# Patient Record
Sex: Male | Born: 1970
Health system: Southern US, Community
[De-identification: ages and names within clinical notes are randomized; demographics above are authoritative.]

---

## 1998-05-26 ENCOUNTER — Emergency Department (HOSPITAL_COMMUNITY): Admission: EM | Admit: 1998-05-26 | Discharge: 1998-05-26 | Payer: Self-pay | Admitting: Emergency Medicine

## 1998-05-26 ENCOUNTER — Encounter: Payer: Self-pay | Admitting: Emergency Medicine

## 2002-07-03 ENCOUNTER — Emergency Department (HOSPITAL_COMMUNITY): Admission: EM | Admit: 2002-07-03 | Discharge: 2002-07-03 | Payer: Self-pay | Admitting: Emergency Medicine

## 2014-12-10 ENCOUNTER — Ambulatory Visit: Payer: Self-pay

## 2017-04-28 ENCOUNTER — Encounter (INDEPENDENT_AMBULATORY_CARE_PROVIDER_SITE_OTHER): Payer: Self-pay | Admitting: Physician Assistant

## 2017-04-28 ENCOUNTER — Ambulatory Visit (INDEPENDENT_AMBULATORY_CARE_PROVIDER_SITE_OTHER): Payer: Self-pay | Admitting: Physician Assistant

## 2017-04-28 VITALS — BP 142/88 | HR 70 | Temp 97.8°F | Ht 67.5 in | Wt 166.6 lb

## 2017-04-28 DIAGNOSIS — H538 Other visual disturbances: Secondary | ICD-10-CM

## 2017-04-28 DIAGNOSIS — Z131 Encounter for screening for diabetes mellitus: Secondary | ICD-10-CM

## 2017-04-28 DIAGNOSIS — R1013 Epigastric pain: Secondary | ICD-10-CM

## 2017-04-28 DIAGNOSIS — Z23 Encounter for immunization: Secondary | ICD-10-CM

## 2017-04-28 DIAGNOSIS — M5442 Lumbago with sciatica, left side: Secondary | ICD-10-CM

## 2017-04-28 DIAGNOSIS — G8929 Other chronic pain: Secondary | ICD-10-CM

## 2017-04-28 LAB — POCT GLYCOSYLATED HEMOGLOBIN (HGB A1C): Hemoglobin A1C: 5.4

## 2017-04-28 MED ORDER — NAPROXEN 500 MG PO TABS: 500.0000 mg | ORAL_TABLET | Freq: Two times a day (BID) | ORAL | 0 refills | Status: AC

## 2017-04-28 MED ORDER — CYCLOBENZAPRINE HCL 10 MG PO TABS: 10.0000 mg | ORAL_TABLET | Freq: Three times a day (TID) | ORAL | 0 refills | Status: AC | PRN

## 2017-04-28 MED ORDER — OMEPRAZOLE 40 MG PO CPDR: 40.0000 mg | DELAYED_RELEASE_CAPSULE | Freq: Every day | ORAL | 3 refills | Status: AC

## 2017-04-28 MED FILL — NAPROXEN 500 MG TABLET: 500 | 15 days supply | Qty: 30 | Fill #0

## 2017-04-28 MED FILL — ?CYCLOBENZAPRINE 10 MG TABL: 10 | 10 days supply | Qty: 30 | Fill #0

## 2017-04-28 MED FILL — OMEPRAZOLE 40 MG CPDR: 40 | 30 days supply | Qty: 30 | Fill #0

## 2017-04-28 NOTE — Patient Instructions (Signed)
Distensión muscular.  (Muscle Strain)  Una distensión muscular es una lesión que se produce cuando un músculo se estira más allá de su largo normal. Cuando esto sucede, por lo general se desgarra un pequeño número de fibras musculares. La distensión muscular se califica en grados. Las distensiones de primer grado son aquellas en las cuales el desgarro y el dolor afectan a la menor cantidad de fibras musculares. Las distensiones de segundo y tercer grado involucran una proporción cada vez mayor de desgarro y dolor.  En general, la recuperación de una distensión muscular tarda de 1 a 2 semanas. La curación completa tarda de 5 a 6 semanas.  CAUSAS  Las distensiones musculares ocurren cuando se aplica una fuerza violenta y repentina sobre un músculo y este se estira demasiado. Esto puede ocurrir cuando se levantan objetos, se practican deportes o en una caída.  FACTORES DE RIESGO  La distensión muscular es especialmente común en los atletas.  SIGNOS Y SÍNTOMAS  En el lugar de la distensión muscular se puede presentar lo siguiente:  · Dolor.  · Moretones.  · Hinchazón.  · Dificultad para usar el músculo debido al dolor o a un funcionamiento anormal.  DIAGNÓSTICO  El médico le hará un examen físico y le hará preguntas sobre sus antecedentes médicos.  TRATAMIENTO  Con frecuencia, el mejor tratamiento para una distensión muscular es el reposo, y la aplicación de hielo y de compresas frías en la zona de la lesión.  INSTRUCCIONES PARA EL CUIDADO EN EL HOGAR  · Use el método PRICE (por sus siglas en inglés) de tratamiento para estimular la curación durante los primeros 2 a 3 días posteriores a la lesión. El método PRICE implica lo siguiente:  ? Proteger al músculo de nuevas lesiones.  ? Limitar la actividad y descansar la parte del cuerpo lesionada.  ? Aplicar hielo a la lesión. Para hacerlo, ponga hielo en una bolsa plástica. Coloque una toalla entre la piel y la bolsa de hielo. Luego aplique el hielo y déjelo actuar de 15 a  20 minutos por hora. Después del tercer día, cambie a compresas de calor húmedo.  ? Comprimir la zona lesionada con una férula o venda elástica. Tenga cuidado de no ajustarla demasiado. Esto puede interferir con la circulación sanguínea o aumentar la hinchazón.  ? Mantener la zona lesionada por encima del nivel del corazón con la mayor frecuencia posible.  · Utilice los medicamentos de venta libre o recetados para calmar el dolor, el malestar o la fiebre, según se lo indique el médico.  · Realizar un calentamiento antes de hacer ejercicio ayuda a prevenir distensiones musculares futuras.    SOLICITE ATENCIÓN MÉDICA SI:  · Siente un dolor cada vez más intenso o hinchazón en la zona lesionada.  · Siente adormecimiento, hormigueo o nota una pérdida importante de fuerza en la zona lesionada.    ASEGÚRESE DE QUE:  · Comprende estas instrucciones.  · Controlará su afección.  · Recibirá ayuda de inmediato si no mejora o si empeora.    Esta información no tiene como fin reemplazar el consejo del médico. Asegúrese de hacerle al médico cualquier pregunta que tenga.  Document Released: 03/25/2005 Document Revised: 04/05/2013 Document Reviewed: 01/12/2013  Elsevier Interactive Patient Education © 2017 [employer]

## 2017-04-28 NOTE — Progress Notes (Signed)
Subjective:  Patient ID: David Rosario, male    DOB: 04/23/1971  Age: 46 y.o. MRN: [id number]  CC:  Back pain and LLQ abdominal pain  HPI David Rosario is a 46 y.o. male with no significant medical history presents as a new patient with complaint of left lower back pain for several months. Feels like "tension". Gets up from bed in the morning feeling stiff. Sometimes feels a jolt running from the lower back to the posterior of left leg.  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 No trauma. Does not endorse weakness, paralysis, saddle paresthesia, urinary incontinence, or fecal incontinence.    Complains of epigastric pain on occasion, bloating, and acid reflux for the past 3 months. Not sure if there is any association to foods. Has not taken anything for relief. Does not endorse melena, BRBPR, f/c/n/v, fecal urgency, tenesmus, or mucus in stool.    Complains of visual blurring that has progressed over that last few years. Mother is diabetic but he has not been diagnosed as a diabetic. Does not endorse polydipsia, polyuria, polyphagia, or fatigue. There is occasional tingling in the hands.      ROS Review of Systems  Constitutional: Negative for chills, fever and malaise/fatigue.  Eyes: Positive for blurred vision.  Respiratory: Negative for shortness of breath.   Cardiovascular: Negative for chest pain and palpitations.  Gastrointestinal: Positive for abdominal pain and heartburn. Negative for blood in stool, constipation, diarrhea, melena, nausea and vomiting.  Genitourinary: Negative for dysuria and hematuria.  Musculoskeletal: Positive for back pain and neck pain. Negative for joint pain and myalgias.  Skin: Negative for rash.  Neurological: Positive for tingling and headaches.  Psychiatric/Behavioral: Negative for depression. The patient is not nervous/anxious.     Objective:  BP (!) 142/88 (BP Location: Left Arm, Patient Position: Sitting, Cuff Size: Normal)    Pulse 70   Temp 97.8 F (36.6 C) (Oral)   Ht 5' 7.5" (1.715 m)   Wt 166 lb 9.6 oz (75.6 kg)   SpO2 97%   BMI 25.71 kg/m   BP/Weight 04/28/2017  Systolic BP 142  Diastolic BP 88  Wt. (Lbs) 166.6  BMI 25.71      Physical Exam  Constitutional: He is oriented to person, place, and time.  Well developed, well nourished, NAD, polite  HENT:  Head: Normocephalic and atraumatic.  Eyes: No scleral icterus.  Neck: Normal range of motion.  Cardiovascular: Normal rate, regular rhythm and normal heart sounds.   Pulmonary/Chest: Effort normal and breath sounds normal. No respiratory distress. He has no wheezes. He has no rales.  Abdominal: Soft. Bowel sounds are normal. There is no tenderness.  Musculoskeletal: He exhibits no edema.  Increased muscular tonicity of the paraspinals of the lower, mid, and upper back.   Neurological: He is alert and oriented to person, place, and time. No cranial nerve deficit. Coordination normal.  Skin: Skin is warm and dry. No rash noted. No erythema. No pallor.  Psychiatric: His behavior is normal. Thought content normal.  Constricted affect  Vitals reviewed.    Assessment & Plan:    1. Chronic midline low back pain with left-sided sciatica - Begin cyclobenzaprine (FLEXERIL) 10 MG tablet; Take 1 tablet (10 mg total) by mouth 3 (three) times daily as needed for muscle spasms.  Dispense: 30 tablet; Refill: 0 - Begin naproxen (NAPROSYN) 500 MG tablet; Take 1 tablet (500 mg total) by mouth 2 (two) times daily with a meal.  Dispense: 30 tablet; Refill: 0 - DG Lumbar Spine Complete; Future  2. Epigastric pain - Begin omeprazole (PRILOSEC) 40 MG capsule; Take 1 capsule (40 mg total) by mouth daily.  Dispense: 30 capsule; Refill: 3  3. Blurring of visual image of both eyes - Ambulatory referral to Ophthalmology  4. Need for Tdap vaccination - Administered Tdap in clinic today  5. Need for prophylactic vaccination and inoculation against influenza -  Administered Influenza vaccination in clinic today.  6. Screening for diabetes mellitus - HgB A1c 5.4% in clinic today.   Meds ordered this encounter  Medications  . cyclobenzaprine (FLEXERIL) 10 MG tablet    Sig: Take 1 tablet (10 mg total) by mouth 3 (three) times daily as needed for muscle spasms.    Dispense:  30 tablet    Refill:  0    Order Specific Question:   Supervising Provider    Answer:   Quentin Angst [id number]  . naproxen (NAPROSYN) 500 MG tablet    Sig: Take 1 tablet (500 mg total) by mouth 2 (two) times daily with a meal.    Dispense:  30 tablet    Refill:  0    Order Specific Question:   Supervising Provider    Answer:   Quentin Angst [id number]  . omeprazole (PRILOSEC) 40 MG capsule    Sig: Take 1 capsule (40 mg total) by mouth daily.    Dispense:  30 capsule    Refill:  3    Order Specific Question:   Supervising Provider    Answer:   Quentin Angst [[id number]]    Follow-up: Return in about 4 weeks (around 05/26/2017) for full physical.   David Specter PA

## 2017-05-27 ENCOUNTER — Ambulatory Visit (INDEPENDENT_AMBULATORY_CARE_PROVIDER_SITE_OTHER): Payer: Self-pay | Admitting: Physician Assistant

## 2017-07-28 ENCOUNTER — Ambulatory Visit: Payer: Self-pay | Attending: Physician Assistant

## 2019-05-02 ENCOUNTER — Encounter (HOSPITAL_BASED_OUTPATIENT_CLINIC_OR_DEPARTMENT_OTHER): Payer: Self-pay | Admitting: *Deleted

## 2019-05-02 ENCOUNTER — Emergency Department (HOSPITAL_BASED_OUTPATIENT_CLINIC_OR_DEPARTMENT_OTHER)
Admission: EM | Admit: 2019-05-02 | Discharge: 2019-05-02 | Disposition: A | Discharge status: Home / Self Care | Payer: Self-pay | Attending: Emergency Medicine | Admitting: Emergency Medicine

## 2019-05-02 ENCOUNTER — Other Ambulatory Visit: Payer: Self-pay

## 2019-05-02 ENCOUNTER — Emergency Department (HOSPITAL_BASED_OUTPATIENT_CLINIC_OR_DEPARTMENT_OTHER): Payer: Self-pay

## 2019-05-02 DIAGNOSIS — Z79899 Other long term (current) drug therapy: Secondary | ICD-10-CM | POA: Insufficient documentation

## 2019-05-02 DIAGNOSIS — Y92015 Private garage of single-family (private) house as the place of occurrence of the external cause: Secondary | ICD-10-CM | POA: Insufficient documentation

## 2019-05-02 DIAGNOSIS — S61219A Laceration without foreign body of unspecified finger without damage to nail, initial encounter: Secondary | ICD-10-CM

## 2019-05-02 DIAGNOSIS — S61215A Laceration without foreign body of left ring finger without damage to nail, initial encounter: Secondary | ICD-10-CM | POA: Insufficient documentation

## 2019-05-02 DIAGNOSIS — W230XXA Caught, crushed, jammed, or pinched between moving objects, initial encounter: Secondary | ICD-10-CM | POA: Insufficient documentation

## 2019-05-02 DIAGNOSIS — Y9389 Activity, other specified: Secondary | ICD-10-CM | POA: Insufficient documentation

## 2019-05-02 DIAGNOSIS — Y999 Unspecified external cause status: Secondary | ICD-10-CM | POA: Insufficient documentation

## 2019-05-02 DIAGNOSIS — S61213A Laceration without foreign body of left middle finger without damage to nail, initial encounter: Secondary | ICD-10-CM | POA: Insufficient documentation

## 2019-05-02 MED ORDER — LIDOCAINE HCL (PF) 1 % IJ SOLN
30.0000 mL | Freq: Once | INTRAMUSCULAR | Status: AC
  Administered 2019-05-02: 17:00:00 30 mL
  Filled 2019-05-02: qty 30

## 2019-05-02 MED ORDER — HYDROCODONE-ACETAMINOPHEN 5-325 MG PO TABS: 2.0000 | ORAL_TABLET | Freq: Four times a day (QID) | ORAL | 0 refills | Status: AC | PRN

## 2019-05-02 MED ORDER — CEPHALEXIN 500 MG PO CAPS: 500.0000 mg | ORAL_CAPSULE | Freq: Two times a day (BID) | ORAL | 0 refills | Status: AC

## 2019-05-02 MED ORDER — IBUPROFEN 800 MG PO TABS
800.0000 mg | ORAL_TABLET | Freq: Once | ORAL | Status: AC
  Administered 2019-05-02: 800 mg via ORAL
  Filled 2019-05-02: qty 1

## 2019-05-02 MED ORDER — TETANUS-DIPHTH-ACELL PERTUSSIS 5-2.5-18.5 LF-MCG/0.5 IM SUSP
0.5000 mL | Freq: Once | INTRAMUSCULAR | Status: DC
  Filled 2019-05-02: qty 0.5

## 2019-05-02 MED ORDER — BACITRACIN ZINC 500 UNIT/GM EX OINT
TOPICAL_OINTMENT | Freq: Two times a day (BID) | CUTANEOUS | Status: DC
  Filled 2019-05-02: qty 28.35

## 2019-05-02 MED FILL — CEPHALEXIN 500 MG CAPSULE: 500 | 7 days supply | Qty: 14 | Fill #0

## 2019-05-02 MED FILL — HYDROCODON-APAP 5-325: 5-325 | 2 days supply | Qty: 6 | Fill #0

## 2019-05-02 NOTE — ED Triage Notes (Signed)
Left hand laceration when 2 metal bars mashed his hand today.

## 2019-05-02 NOTE — ED Provider Notes (Signed)
Medical screening examination/treatment/procedure(s) were conducted as a shared visit with non-physician practitioner(s) and myself.  I personally evaluated the patient during the encounter. Briefly, the patient is a 48 y.o. male who presents to the ED with laceration to the left hand.  Laceration to the volar portion of the second, third and fourth digits.  Patient with good range of motion of the left fingers.  No concern for tendon or ligamentous injury. Appears injury mostly to soft tissue. X-ray showed no fracture.  Overall wounds are hemostatic.  Washed out and repaired.  Understands return precautions.  Will prescribe antibiotics.  Tetanus shot updated.  Given information for follow-up for suture removal.  He is not sure if he has a primary care doctor still and will give him for hand as well as told to return to the ED for suture removal and wound recheck.   EKG Interpretation None           Lennice Sites, DO 05/02/19 1520

## 2019-05-02 NOTE — ED Provider Notes (Addendum)
[facility] EMERGENCY DEPARTMENT Provider Note   CSN: [id number] Arrival date & time: 05/02/19  1404     History   Chief Complaint Chief Complaint  Patient presents with   Extremity Laceration    HPI David Rosario is a 48 y.o. male.     Patient is a 48 year old male with no past medical history presenting to the emergency department for left hand injury.  Patient reports that he was fixing a garage door when it fell on his hand.  Reports laceration and pain to the second third and fourth digits of the left hand.  Bleeding is controlled.  Unknown last tetanus.     History reviewed. No pertinent past medical history.  There are no active problems to display for this patient.   History reviewed. No pertinent surgical history.      Home Medications    Prior to Admission medications   Medication Sig Start Date End Date Taking? Authorizing Provider  cephALEXin (KEFLEX) 500 MG capsule Take 1 capsule (500 mg total) by mouth 2 (two) times daily for 7 days. 05/02/19 05/09/19  Ronnie Doss A, PA-C  cyclobenzaprine (FLEXERIL) 10 MG tablet Take 1 tablet (10 mg total) by mouth 3 (three) times daily as needed for muscle spasms. 04/28/17   Loletta Specter, PA-C  HYDROcodone-acetaminophen (NORCO/VICODIN) 5-325 MG tablet Take 2 tablets by mouth every 6 (six) hours as needed for up to 2 days. 05/02/19 05/04/19  Arlyn Dunning, PA-C  naproxen (NAPROSYN) 500 MG tablet Take 1 tablet (500 mg total) by mouth 2 (two) times daily with a meal. 04/28/17   Loletta Specter, PA-C  omeprazole (PRILOSEC) 40 MG capsule Take 1 capsule (40 mg total) by mouth daily. 04/28/17   Loletta Specter, PA-C    Family History No family history on file.  Social History Social History   Tobacco Use   Smoking status: Never Smoker   Smokeless tobacco: Never Used  Substance Use Topics   Alcohol use: Yes    Frequency: Never   Drug use: Never     Allergies   Patient has no known  allergies.   Review of Systems Review of Systems  Constitutional: Negative for fever.  Skin: Positive for wound. Negative for rash.  Allergic/Immunologic: Negative for immunocompromised state.  Neurological: Negative for light-headedness.  Hematological: Does not bruise/bleed easily.     Physical Exam Updated Vital Signs BP 134/85    Pulse 73    Temp 99 F (37.2 C) (Oral)    Resp 14    Ht 5\' 6"  (1.676 m)    Wt 71.7 kg    SpO2 100%    BMI 25.50 kg/m   Physical Exam Vitals signs and nursing note reviewed.  Constitutional:      Appearance: Normal appearance.  HENT:     Head: Normocephalic.  Eyes:     Conjunctiva/sclera: Conjunctivae normal.  Pulmonary:     Effort: Pulmonary effort is normal.  Musculoskeletal:     Comments: Lacerations to the dorsum of the left 3-5 digits at the level of 1st mid phalanx and most deep on the long finger. Patient has normal extension but pain limits a good exam with flexion.  Skin:    General: Skin is dry.  Neurological:     Mental Status: He is alert.  Psychiatric:        Mood and Affect: Mood normal.      ED Treatments / Results  Labs (all labs ordered are listed,  but only abnormal results are displayed) Labs Reviewed - No data to display  EKG None  Radiology Dg Hand Complete Left  Result Date: 05/02/2019 CLINICAL DATA:  Crushing injury, laceration of the third and fourth digits with bleeding EXAM: LEFT HAND - COMPLETE 3+ VIEW COMPARISON:  None. FINDINGS: Bandaging material is seen over the second through fifth digits which obscures fine bone and soft-tissue detail. No definite fracture or traumatic malalignment is seen. Laceration across the second digit at the base of the middle phalanx and the third and fourth digits is noted. No subjacent osseous injury or defect. No traumatic malalignment. IMPRESSION: 1. Laceration seen of the second, third and fourth digits. 2. No subjacent osseous injury or no traumatic malalignment. 3.  Overlying bandaging material slightly obscures fine bone and soft-tissue detail. If there is persisting clinical concern for osseous injury, recommend removal of the bandaging material and reimaging Electronically Signed   By: Kreg ShropshirePrice  DeHay M.D.   On: 05/02/2019 14:50    Procedures .Marland Kitchen.Laceration Repair  Date/Time: 05/02/2019 4:04 PM Performed by: Arlyn DunningMcLean, Tyton Abdallah A, PA-C Authorized by: Arlyn DunningMcLean, Jahred Tatar A, PA-C   Consent:    Consent obtained:  Verbal   Consent given by:  Patient   Risks discussed:  Infection, need for additional repair, pain, poor cosmetic result and poor wound healing   Alternatives discussed:  No treatment and delayed treatment Universal protocol:    Procedure explained and questions answered to patient or proxy's satisfaction: yes     Relevant documents present and verified: yes     Test results available and properly labeled: yes     Imaging studies available: yes     Required blood products, implants, devices, and special equipment available: yes     Site/side marked: yes     Immediately prior to procedure, a time out was called: yes     Patient identity confirmed:  Verbally with patient Anesthesia (see MAR for exact dosages):    Anesthesia method:  Local infiltration Laceration details:    Location:  Hand   Hand location: left third and fourth digit.   Length (cm):  3   Depth (mm):  5 Pre-procedure details:    Preparation:  Patient was prepped and draped in usual sterile fashion Exploration:    Hemostasis achieved with:  Tourniquet and direct pressure   Wound exploration: wound explored through full range of motion and entire depth of wound probed and visualized     Wound extent: no foreign bodies/material noted, no muscle damage noted, no tendon damage noted and no underlying fracture noted     Contaminated: no   Treatment:    Area cleansed with:  Betadine and soap and water   Amount of cleaning:  Extensive   Irrigation solution:  Sterile saline Skin repair:     Repair method:  Sutures   Suture size:  4-0   Suture material:  Nylon   Number of sutures:  18 Approximation:    Approximation:  Close Post-procedure details:    Dressing:  Antibiotic ointment and non-adherent dressing   Patient tolerance of procedure:  Tolerated well, no immediate complications   (including critical care time)  Medications Ordered in ED Medications  lidocaine (PF) (XYLOCAINE) 1 % injection 30 mL (has no administration in time range)  bacitracin ointment (has no administration in time range)  ibuprofen (ADVIL) tablet 800 mg (800 mg Oral Given 05/02/19 1518)     Initial Impression / Assessment and Plan / ED Course  I have reviewed  the triage vital signs and the nursing notes.  Pertinent labs & imaging results that were available during my care of the patient were reviewed by me and considered in my medical decision making (see chart for details).  Clinical Course as of May 01 1644  Tue May 02, 2019  1600 Patient here for a laceration of the left third and fourth digits.  Does not appear that he has any neurovascular or tendinous injury.  He was examined by myself and Dr. Ronnald Nian.  Patient was sutured by myself with the assistance of PA student.  Patient will be started on Keflex.  Last tetanus shot was 3 years ago.  We will have him follow-up with hand surgeon. No underlying bony injury   [KM]    Clinical Course User Index [KM] Alveria Apley, PA-C       Based on review of vitals, medical screening exam, lab work and/or imaging, there does not appear to be an acute, emergent etiology for the patient's symptoms. Counseled pt on good return precautions and encouraged both PCP and ED follow-up as needed.  Prior to discharge, I also discussed incidental imaging findings with patient in detail and advised appropriate, recommended follow-up in detail.  Clinical Impression: 1. Laceration of finger of left hand without foreign body without damage to nail,  unspecified finger, initial encounter     Disposition: Discharge  Prior to providing a prescription for a controlled substance, I independently reviewed the patient's recent prescription history on the [facility]. The patient had no recent or regular prescriptions and was deemed appropriate for a brief, less than 3 day prescription of narcotic for acute analgesia.  This note was prepared with assistance of Systems analyst. Occasional wrong-word or sound-a-like substitutions may have occurred due to the inherent limitations of voice recognition software.   Final Clinical Impressions(s) / ED Diagnoses   Final diagnoses:  Laceration of finger of left hand without foreign body without damage to nail, unspecified finger, initial encounter    ED Discharge Orders         Ordered    cephALEXin (KEFLEX) 500 MG capsule  2 times daily     05/02/19 1603    HYDROcodone-acetaminophen (NORCO/VICODIN) 5-325 MG tablet  Every 6 hours PRN     05/02/19 1639           Alveria Apley, PA-C 05/02/19 1644    Alveria Apley, PA-C 05/02/19 [place], [place], DO 05/02/19 1714

## 2019-05-02 NOTE — Discharge Instructions (Signed)
Thank you for allowing me to care for you today. Please return to the emergency department if you have new or worsening symptoms. Take your medications as instructed.  ° °

## 2019-05-12 ENCOUNTER — Emergency Department (HOSPITAL_BASED_OUTPATIENT_CLINIC_OR_DEPARTMENT_OTHER)
Admission: EM | Admit: 2019-05-12 | Discharge: 2019-05-12 | Disposition: A | Discharge status: Home / Self Care | Payer: Self-pay | Attending: Emergency Medicine | Admitting: Emergency Medicine

## 2019-05-12 ENCOUNTER — Encounter (HOSPITAL_BASED_OUTPATIENT_CLINIC_OR_DEPARTMENT_OTHER): Payer: Self-pay | Admitting: *Deleted

## 2019-05-12 ENCOUNTER — Other Ambulatory Visit: Payer: Self-pay

## 2019-05-12 DIAGNOSIS — S61412D Laceration without foreign body of left hand, subsequent encounter: Secondary | ICD-10-CM | POA: Insufficient documentation

## 2019-05-12 DIAGNOSIS — Z5189 Encounter for other specified aftercare: Secondary | ICD-10-CM | POA: Insufficient documentation

## 2019-05-12 DIAGNOSIS — X58XXXD Exposure to other specified factors, subsequent encounter: Secondary | ICD-10-CM | POA: Insufficient documentation

## 2019-05-12 NOTE — ED Provider Notes (Signed)
[facility] EMERGENCY DEPARTMENT Provider Note   CSN: [id number] Arrival date & time: 05/12/19  1642     History   Chief Complaint Chief Complaint  Patient presents with  . Wound Check    HPI David Rosario is a 48 y.o. male.     Patient presents to the emergency department for wound check of his left hand.  Patient sustained lacerations to his left third and fourth digits which are closed with #18 sutures 10 days ago.  Patient states he was originally told get rechecked in 7 days, but wanted to wait.  He was prescribed antibiotics at previous visit.  Looks like he was given hand surgery referral, however is not seen anyone since that time.  He is able to move his fingers.  He reports some weakness and continued swelling.  He has some tingling and decreased sensation to the fingertips on both of those fingers.  No drainage.  No fevers or redness.     History reviewed. No pertinent past medical history.  There are no active problems to display for this patient.   History reviewed. No pertinent surgical history.      Home Medications    Prior to Admission medications   Medication Sig Start Date End Date Taking? Authorizing Provider  cyclobenzaprine (FLEXERIL) 10 MG tablet Take 1 tablet (10 mg total) by mouth 3 (three) times daily as needed for muscle spasms. 04/28/17   Loletta Specter, PA-C  naproxen (NAPROSYN) 500 MG tablet Take 1 tablet (500 mg total) by mouth 2 (two) times daily with a meal. 04/28/17   Loletta Specter, PA-C  omeprazole (PRILOSEC) 40 MG capsule Take 1 capsule (40 mg total) by mouth daily. 04/28/17   Loletta Specter, PA-C    Family History No family history on file.  Social History Social History   Tobacco Use  . Smoking status: Never Smoker  . Smokeless tobacco: Never Used  Substance Use Topics  . Alcohol use: Yes    Frequency: Never  . Drug use: Never     Allergies   Patient has no known allergies.   Review of  Systems Review of Systems  Constitutional: Negative for activity change.  Musculoskeletal: Negative for arthralgias, joint swelling and neck pain.  Skin: Positive for wound. Negative for color change.  Neurological: Positive for weakness and numbness.     Physical Exam Updated Vital Signs BP 125/83   Pulse 64   Temp 98.2 F (36.8 C) (Oral)   Resp 20   Ht 5\' 7"  (1.702 m)   Wt 71.7 kg   SpO2 100%   BMI 24.76 kg/m   Physical Exam Vitals signs and nursing note reviewed.  Constitutional:      Appearance: He is well-developed.  HENT:     Head: Normocephalic and atraumatic.  Eyes:     Conjunctiva/sclera: Conjunctivae normal.  Neck:     Musculoskeletal: Normal range of motion and neck supple.  Cardiovascular:     Pulses: Normal pulses. No decreased pulses.  Musculoskeletal:        General: Tenderness present.     Comments: Patient with generalized swelling of the left third and fourth digits.  No erythema or signs of cellulitis.  No purulent drainage.  Patient is able to flex and extend both affected fingers reasonably well.  Skin:    General: Skin is warm and dry.     Comments: Patient with lacerations to the volar aspect of the left third and fourth  digits with sutures in place.  Wounds appear well approximated, clean, no drainage.  Neurological:     Mental Status: He is alert.     Sensory: No sensory deficit.     Comments: Patient reports some decreased sensation to the radial aspect of the left long finger and left ring finger.        ED Treatments / Results  Labs (all labs ordered are listed, but only abnormal results are displayed) Labs Reviewed - No data to display  EKG None  Radiology No results found.  Procedures Procedures (including critical care time)  Medications Ordered in ED Medications - No data to display   Initial Impression / Assessment and Plan / ED Course  I have reviewed the triage vital signs and the nursing notes.  Pertinent labs &  imaging results that were available during my care of the patient were reviewed by me and considered in my medical decision making (see chart for details).        Patient seen and examined.  Wounds appear to be healing well.  Patient has expected sensation changes after a deep cut of this nature to the fingers.  He likely sustained injury to the cutaneous nerves.  We discussed that time will tell how well the sensation returns to his fingers.  This can take months to up to a year.  He is encouraged to follow-up with hand surgery if desired.  Otherwise, we decided to wait an additional 4 days at least to remove sutures.  He states he will likely return to the ED next Wednesday, 05/17/2019 for reexam.  Vital signs reviewed and are as follows: BP 125/83   Pulse 64   Temp 98.2 F (36.8 C) (Oral)   Resp 20   Ht 5\' 7"  (1.702 m)   Wt 71.7 kg   SpO2 100%   BMI 24.76 kg/m     Final Clinical Impressions(s) / ED Diagnoses   Final diagnoses:  Visit for wound check   Patient with healing lacerations as noted above.  He would likely benefit from a few more days of healing before sutures removed.  Patient with reasonable range of motion at this point.  Cannot entirely rule out tendon injury however this was not noted on initial exam.  Patient does have some distal decreased sensation of the fingers which is not unexpected given these lacerations.  ED Discharge Orders    None       Carlisle Cater, Hershal Coria 05/12/19 1809    Fredia Sorrow, MD 05/21/19 [id number]

## 2019-05-12 NOTE — Discharge Instructions (Signed)
Please read and follow all provided instructions.  Your diagnoses today include:  1. Visit for wound check     Tests performed today include:  Vital signs. See below for your results today.   Medications prescribed:   None  Take any prescribed medications only as directed.   Home care instructions:  Follow any educational materials and wound care instructions contained in this packet.   Keep affected area above the level of your heart when possible to minimize swelling. Wash area gently twice a day with warm soapy water. Do not apply alcohol or hydrogen peroxide. Cover the area if it draining or weeping.   Follow-up instructions: Suture Removal: Return to the Emergency Department or see your primary care care doctor in 4 days for a recheck of your wound and removal of your sutures or staples.    Return instructions:  Return to the Emergency Department if you have:  Fever  Worsening pain  Worsening swelling of the wound  Pus draining from the wound  Redness of the skin that moves away from the wound, especially if it streaks away from the affected area   Any other emergent concerns  Your vital signs today were: BP 125/83    Pulse 64    Temp 98.2 F (36.8 C) (Oral)    Resp 20    Ht 5\' 7"  (1.702 m)    Wt 71.7 kg    SpO2 100%    BMI 24.76 kg/m  If your blood pressure (BP) was elevated above 135/85 this visit, please have this repeated by your doctor within one month. --------------

## 2019-05-12 NOTE — ED Triage Notes (Signed)
Crush injury to his left hand a week ago. He is here for wound check and possible suture removal.

## 2019-05-12 NOTE — ED Notes (Signed)
ED Provider at bedside. 

## 2019-05-19 ENCOUNTER — Other Ambulatory Visit: Payer: Self-pay

## 2019-05-19 ENCOUNTER — Encounter (HOSPITAL_BASED_OUTPATIENT_CLINIC_OR_DEPARTMENT_OTHER): Payer: Self-pay

## 2019-05-19 ENCOUNTER — Emergency Department (HOSPITAL_BASED_OUTPATIENT_CLINIC_OR_DEPARTMENT_OTHER)
Admission: EM | Admit: 2019-05-19 | Discharge: 2019-05-19 | Disposition: A | Discharge status: Home / Self Care | Payer: Self-pay | Attending: Emergency Medicine | Admitting: Emergency Medicine

## 2019-05-19 DIAGNOSIS — Z4802 Encounter for removal of sutures: Secondary | ICD-10-CM | POA: Insufficient documentation

## 2019-05-19 DIAGNOSIS — Z79899 Other long term (current) drug therapy: Secondary | ICD-10-CM | POA: Insufficient documentation

## 2019-05-19 NOTE — ED Triage Notes (Signed)
Pt for suture removal to left 3rd/4th finger-placed 11/3-NAD-steady gait-son/interpreter with pt

## 2019-05-19 NOTE — ED Provider Notes (Signed)
[facility] EMERGENCY DEPARTMENT Provider Note   CSN: [id number] Arrival date & time: 05/19/19  1845     History   Chief Complaint Chief Complaint  Patient presents with  . Suture / Staple Removal    HPI David Rosario is a 48 y.o. male.   Patient is Spanish-speaking.  His family members at bedside and is acting as a Optometrist.  He declined formal Optometrist.  HPI   48 year old male presenting for evaluation of suture removal.  Had sutures placed 05/02/2019.  Since then he has had no fevers.  Pain is improved.  Denies any redness, swelling or drainage from the wounds.  History reviewed. No pertinent past medical history.  There are no active problems to display for this patient.   History reviewed. No pertinent surgical history.      Home Medications    Prior to Admission medications   Medication Sig Start Date End Date Taking? Authorizing Provider  cyclobenzaprine (FLEXERIL) 10 MG tablet Take 1 tablet (10 mg total) by mouth 3 (three) times daily as needed for muscle spasms. 04/28/17   Clent Demark, PA-C  naproxen (NAPROSYN) 500 MG tablet Take 1 tablet (500 mg total) by mouth 2 (two) times daily with a meal. 04/28/17   Clent Demark, PA-C  omeprazole (PRILOSEC) 40 MG capsule Take 1 capsule (40 mg total) by mouth daily. 04/28/17   Clent Demark, PA-C    Family History No family history on file.  Social History Social History   Tobacco Use  . Smoking status: Never Smoker  . Smokeless tobacco: Never Used  Substance Use Topics  . Alcohol use: Yes    Frequency: Never  . Drug use: Never     Allergies   Patient has no known allergies.   Review of Systems Review of Systems  Constitutional: Negative for fever.  Skin: Positive for wound. Negative for color change.     Physical Exam Updated Vital Signs BP 119/90 (BP Location: Left Arm)   Pulse 66   Temp 98.1 F (36.7 C) (Oral)   Resp 16   SpO2 100%   Physical Exam  Constitutional:      General: He is not in acute distress.    Appearance: He is well-developed.  Eyes:     Conjunctiva/sclera: Conjunctivae normal.  Cardiovascular:     Rate and Rhythm: Normal rate.  Pulmonary:     Effort: Pulmonary effort is normal.  Musculoskeletal:     Comments: Well-healing wounds to the palmar aspect of the left third and fourth digits.  No warmth, erythema, tenderness, fluctuance or drainage noted.  Brisk cap refill distally.  Mild decrease in sensation to the third digit that he states has been present since his injury.  Skin:    General: Skin is warm and dry.  Neurological:     Mental Status: He is alert and oriented to person, place, and time.      ED Treatments / Results  Labs (all labs ordered are listed, but only abnormal results are displayed) Labs Reviewed - No data to display  EKG None  Radiology No results found.  Procedures Procedures (including critical care time)  Medications Ordered in ED Medications - No data to display   Initial Impression / Assessment and Plan / ED Course  I have reviewed the triage vital signs and the nursing notes.  Pertinent labs & imaging results that were available during my care of the patient were reviewed by me and considered in  my medical decision making (see chart for details).     Final Clinical Impressions(s) / ED Diagnoses   Final diagnoses:  Visit for suture removal   Staple removal   Pt to ER for staple/suture removal and wound check as above. Procedure tolerated well. Vitals normal, no signs of infection. Scar minimization & return precautions given at dc.  Advised to follow-up with hand surgery.  Advised to return if worse.   ED Discharge Orders    None       Rayne Du 05/19/19 1933    Gwyneth Sprout, MD 05/19/19 [phone]

## 2019-05-19 NOTE — ED Notes (Signed)
ED Provider at bedside. 

## 2019-05-19 NOTE — Discharge Instructions (Signed)
Follow up with hand surgery in 1-2  weeks.   Please return to the emergency room immediately if you experience any new or worsening symptoms or any symptoms that indicate worsening infection such as fevers, increased redness/swelling/pain, warmth, or drainage from the affected area.

## 2020-10-20 IMAGING — DX DG HAND COMPLETE 3+V*L*
4 series · 4 of 4 positions shown · non-contrast
Comparison: None.

CLINICAL DATA: Crushing injury, laceration of the third and fourth
digits with bleeding

EXAM:
LEFT HAND - COMPLETE 3+ VIEW

[hand pa]
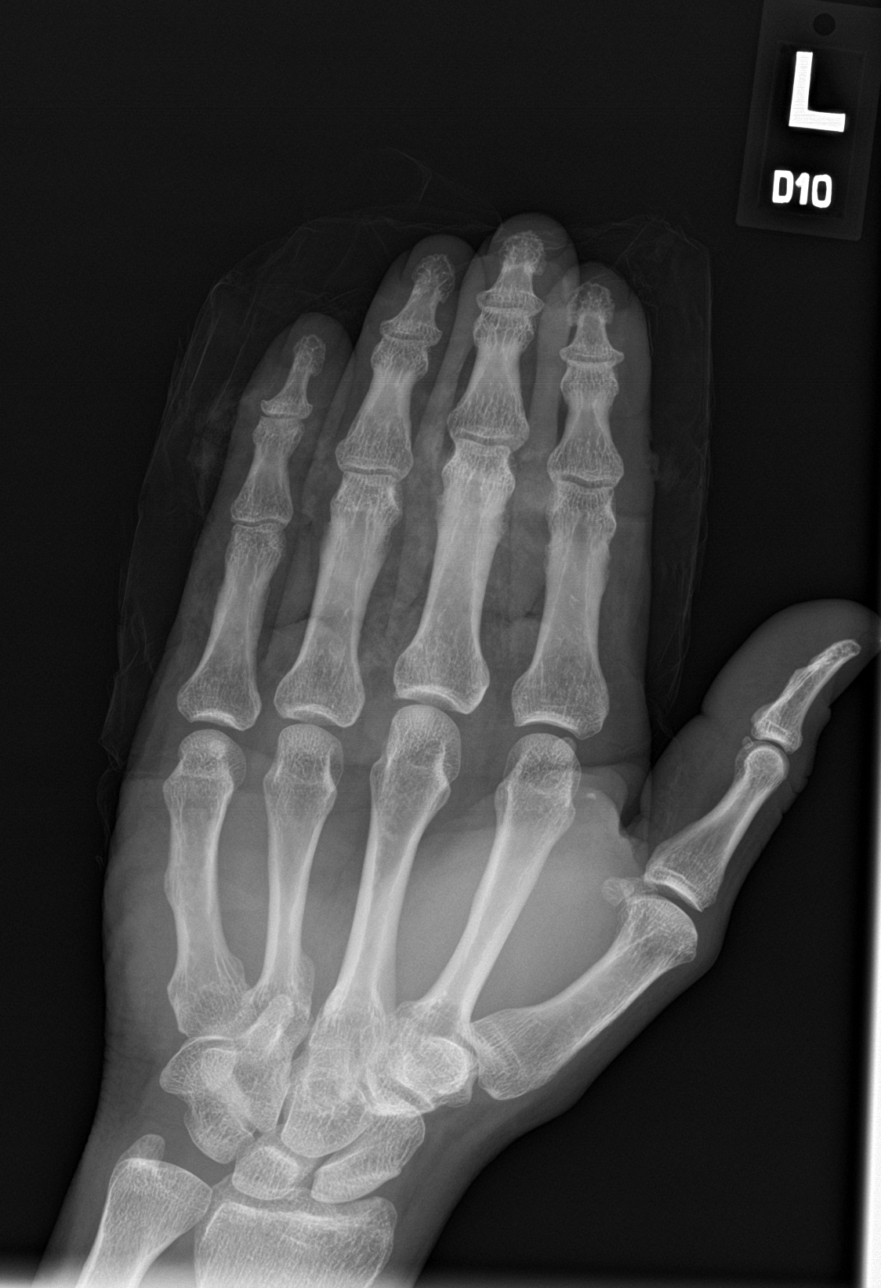

[hand obl (1 of 2)]
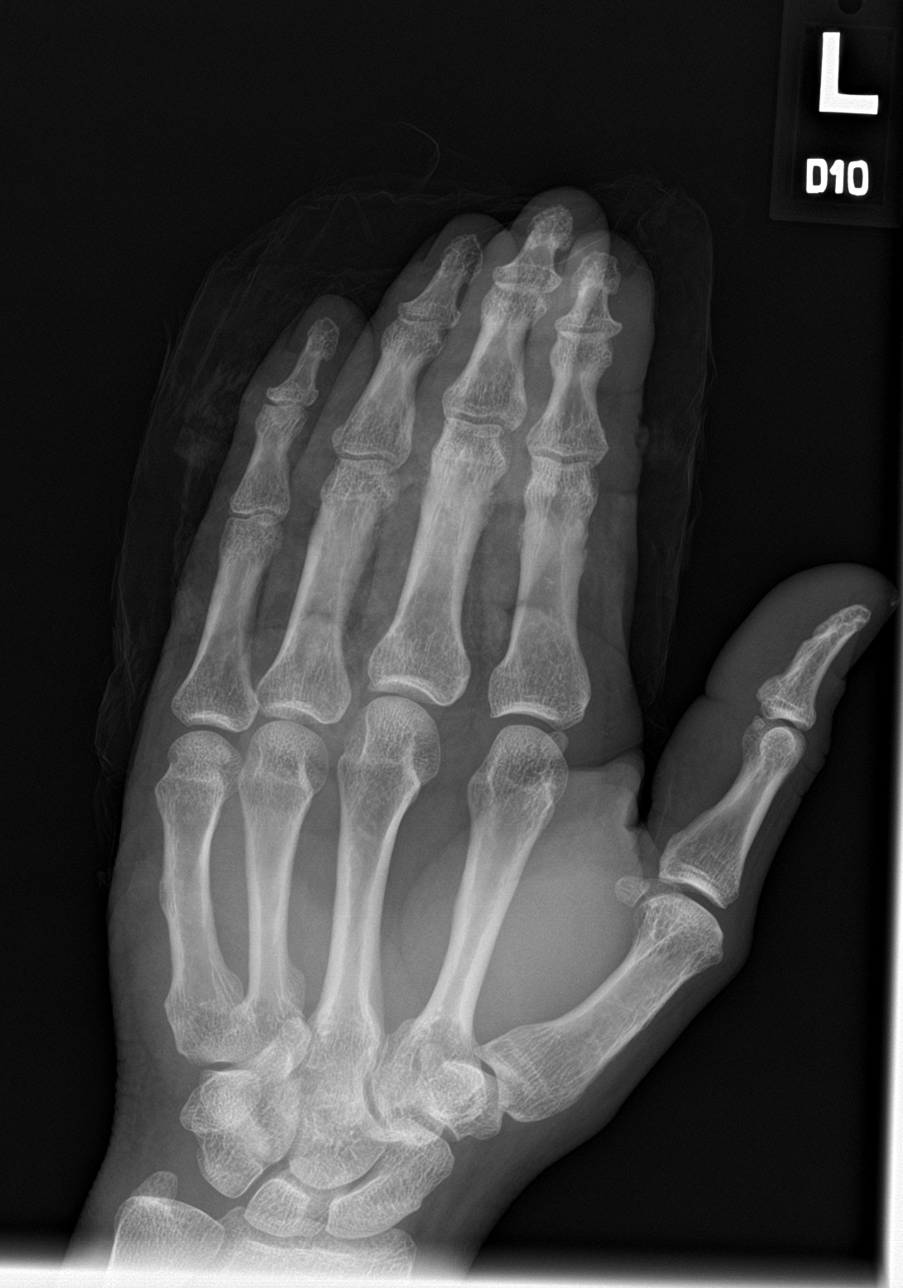

[hand lat]
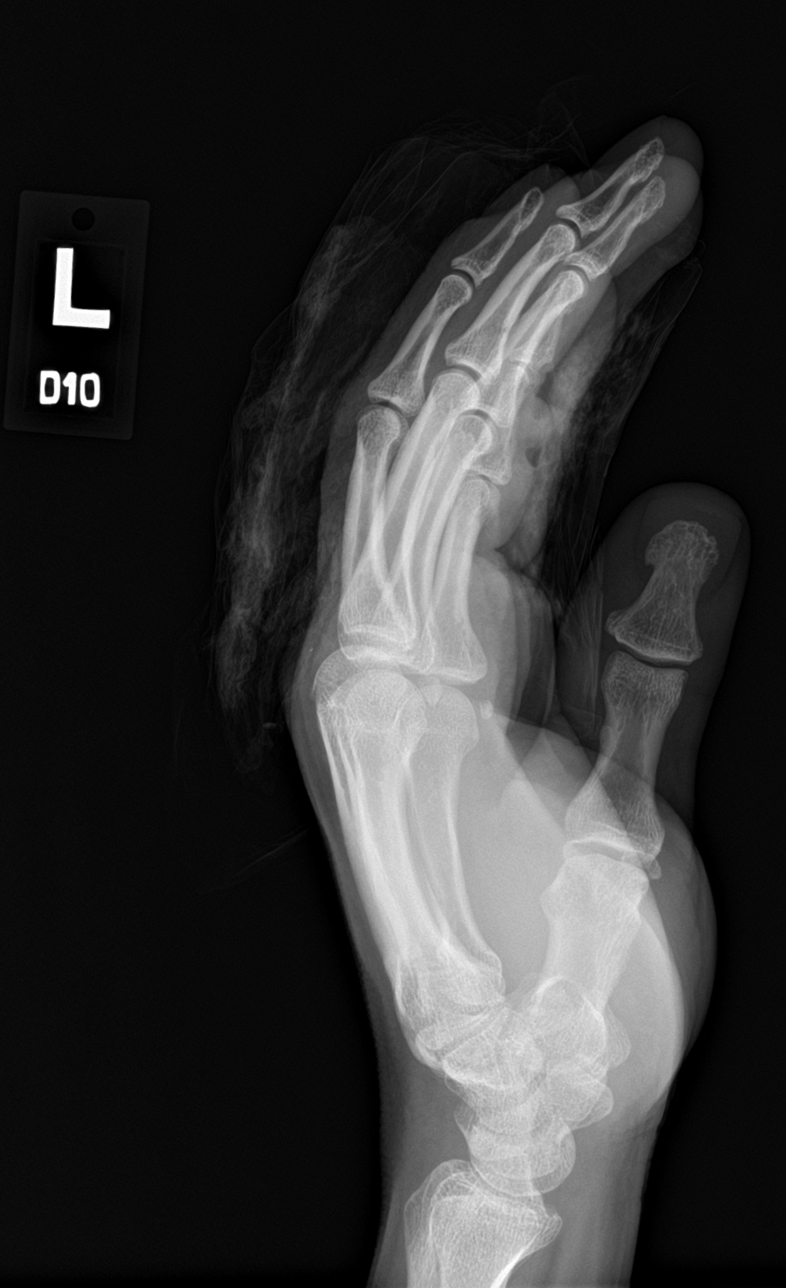

[hand obl (2 of 2)]
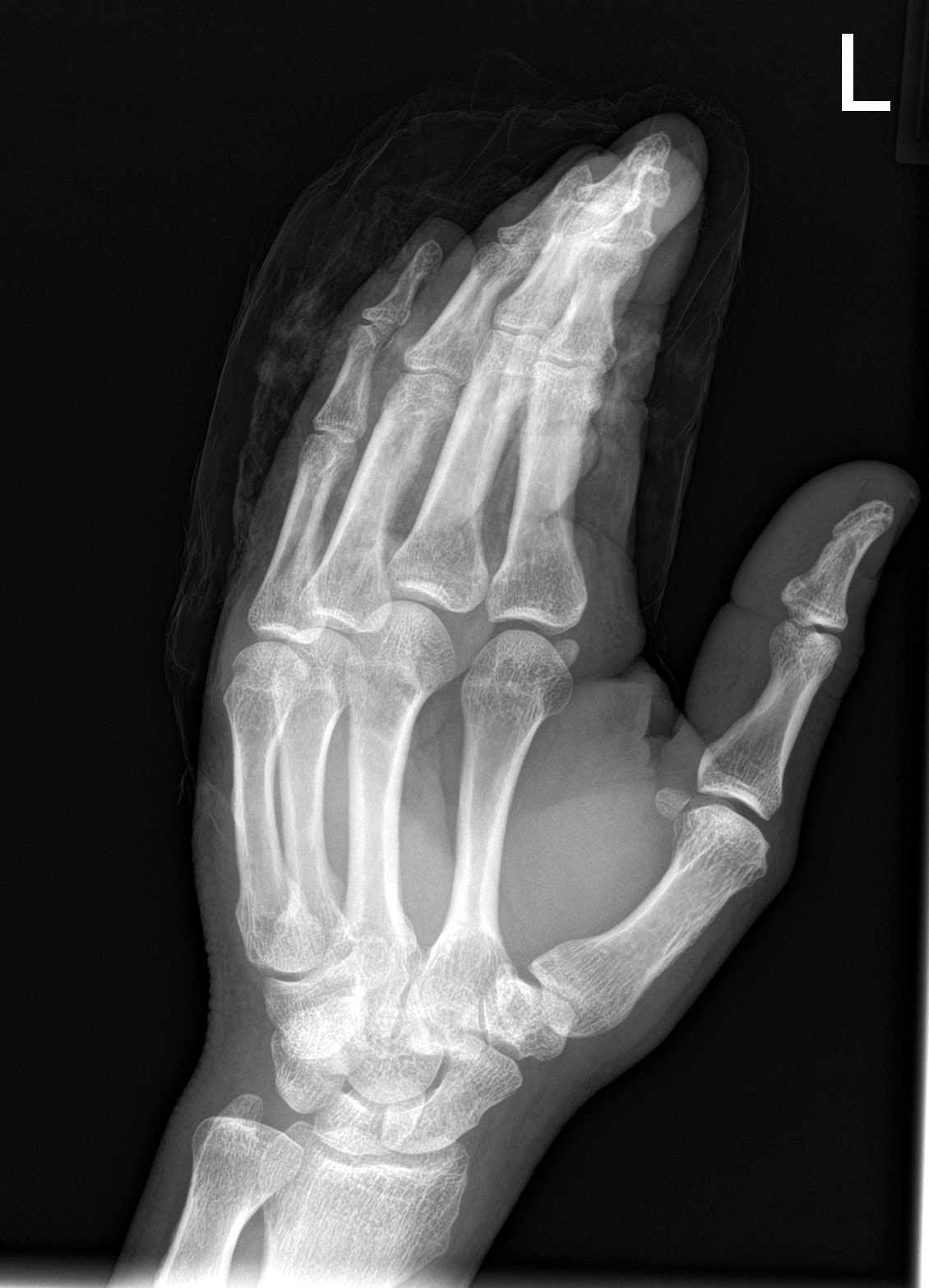

[4 of 4 positions shown; findings below may reference images not displayed]

FINDINGS: Bandaging material is seen over the second through fifth digits
which obscures fine bone and soft-tissue detail. No definite
fracture or traumatic malalignment is seen. Laceration across the
second digit at the base of the middle phalanx and the third and
fourth digits is noted. No subjacent osseous injury or defect. No
traumatic malalignment.
IMPRESSION: 1. Laceration seen of the second, third and fourth digits.
2. No subjacent osseous injury or no traumatic malalignment.
3. Overlying bandaging material slightly obscures fine bone and
soft-tissue detail. If there is persisting clinical concern for
osseous injury, recommend removal of the bandaging material and
reimaging
